# Patient Record
Sex: Female | Born: 1935 | Race: Black or African American | Hispanic: No
Health system: Southern US, Community
[De-identification: ages and names within clinical notes are randomized; demographics above are authoritative.]

---

## 2008-12-20 ENCOUNTER — Ambulatory Visit: Payer: Self-pay | Admitting: Internal Medicine

## 2009-01-30 ENCOUNTER — Ambulatory Visit: Payer: Self-pay | Admitting: Surgery

## 2010-08-21 NOTE — Assessment & Plan Note (Signed)
OFFICE VISIT   [name], [name]  DOB:  1916-05-03                                       01/30/2009  CHART#:[id number]   REASON FOR VISIT:  Carotid disease.   CHIEF COMPLAINT:  Of dizziness.   HISTORY:  This is a 77 year old female I am seeing at request of Dr.  Welton Flakes for evaluation of carotid disease.  The patient was found to have a  left carotid bruit approximately a year ago, it had increased in  intensity and therefore a carotid ultrasound was ordered.  This was  performed and found moderate left carotid stenosis.  A CT angiogram was  then obtained which reveals 55%-65% left carotid stenosis.  The patient  is asymptomatic.  She denies numbness or weakness in either extremity.  She denies slurring of speech.  She denies dysarthria.  She denies  amaurosis fugax.  The patient has a history of diabetes, her hemoglobin  A1c have been in the 6-7 range.  She has a history of hypertension for  which she is on chronic medical therapy.  She also has  hypercholesterolemia, but is no longer taking her medication.   REVIEW OF SYSTEMS:  Is positive for heart murmur and constipation,  burning with urination, frequent urination, dizziness and nervousness.  Otherwise review of systems is negative as detailed in the encounter  form.   PAST MEDICAL HISTORY:  Diabetes, hypertension, hypercholesterolemia.   PAST SURGICAL HISTORY:  Is a hysterectomy.   SOCIAL HISTORY:  She is widowed with five children.  She is retired.  Does not smoke.  Has never smoked.  Does not drink.   MEDICATIONS:  Please see medical record.   ALLERGIES:  PENICILLIN.   PHYSICAL EXAMINATION:  Heart rate 73, blood pressure 154/65, O2  saturations 97%.  General:  She is well-appearing, no distress.  HEENT:  Normocephalic, atraumatic.  Pupils equal.  Sclerae anicteric and  extraocular muscles are intact.  Neck:  Supple.  No JVD.  Left carotid  bruit.  Lungs are clear bilaterally without wheezing.   Cardiovascular:  Regular rate and rhythm.  Extremities are warm, well-perfused.  Abdomen:  Soft, nontender.  No masses.  Musculoskeletal:  No major deformities,  cyanosis.  Neurologic:  Nonfocal.  Psych is alert and x3.  Skin is  without rash.   DIAGNOSTIC STUDIES:  I have read the reports of the CT angiogram and  carotid Dopplers, she has mild left carotid stenosis.   ASSESSMENT/PLAN:  Asymptomatic left carotid stenosis.   PLAN:  I discussed the importance of going back on a cholesterol  lowering agent.  She is going to contact Dr. Welton Flakes to restart some form  of a statin and hopefully this will help stabilize the progression of  her disease.  At this point in time as long as she remains asymptomatic,  I would not recommend any surgical repair and I am going to have her  come back to see me in 1 year with a repeat ultrasound for continuity.   Jorge Ny, MD  Electronically Signed   VWB/MEDQ  D:  01/30/2009  T:  01/31/2009  Job:  [id number]   cc:   Dr. Welton Flakes

## 2010-10-24 IMAGING — CT CT ANGIOGRAPHY NECK
1 of 4 series · 12 of 33 positions shown · non-contrast
Comparison: none

REASON FOR EXAM: syncope  pt is diabetic takes Glucophage
COMMENTS:

[Series 4: soft tissue · axial · 0.38mm/px · z∈[-4,+252]mm · 12 of 101 slices shown]
[im 8/101  soft-tissue]
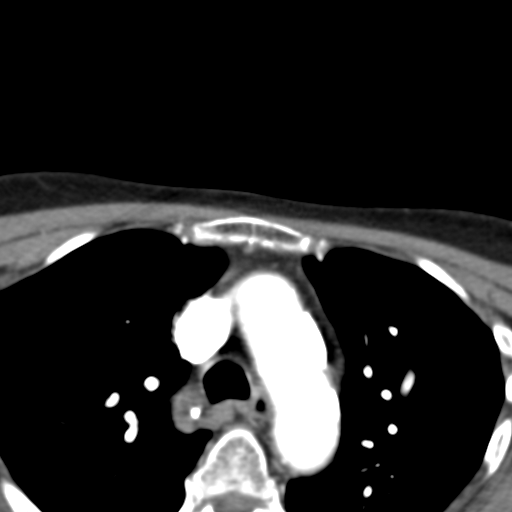
[im 16/101  bone]
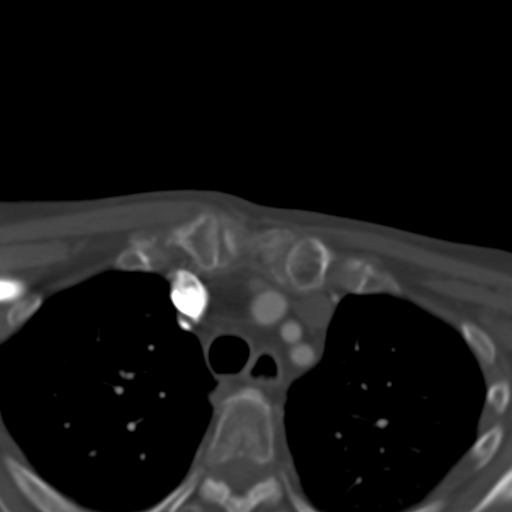
[im 24/101  soft-tissue]
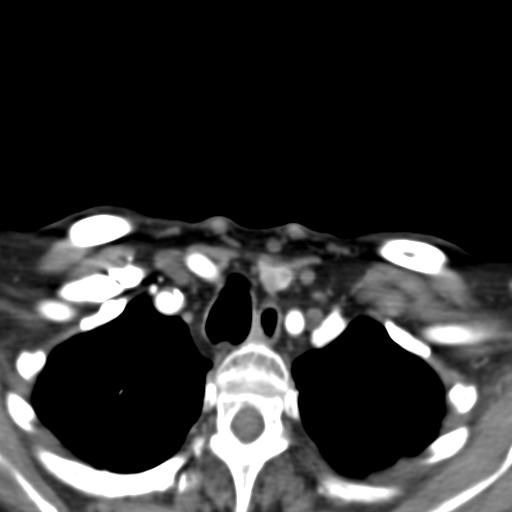
[im 31/101  bone]
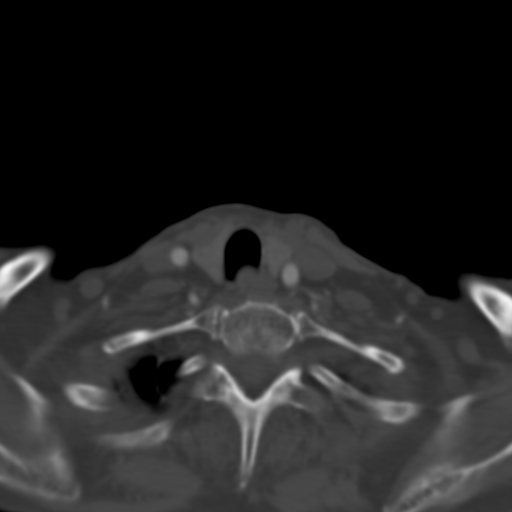
[im 39/101  soft-tissue]
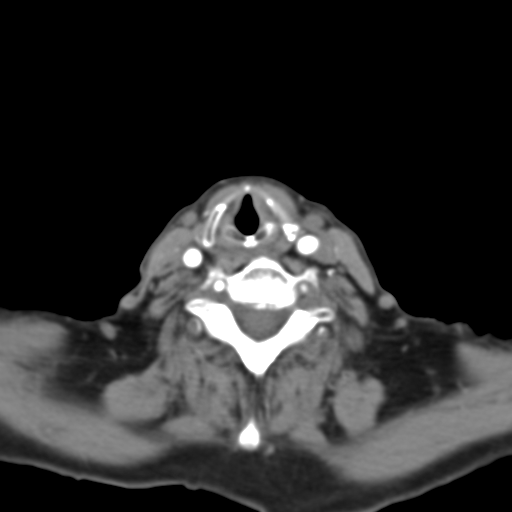
[im 47/101  bone]
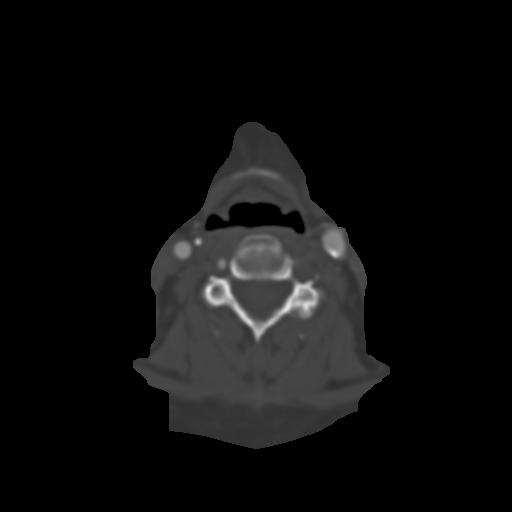
[im 54/101  soft-tissue]
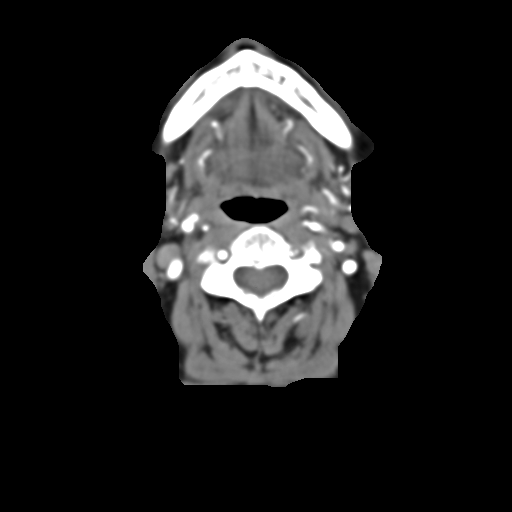
[im 62/101  bone]
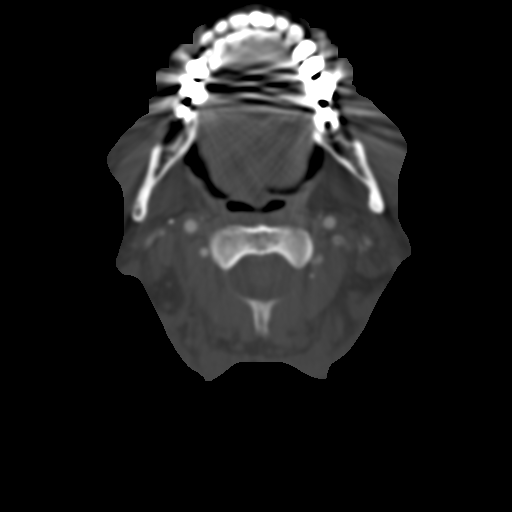
[im 70/101  soft-tissue]
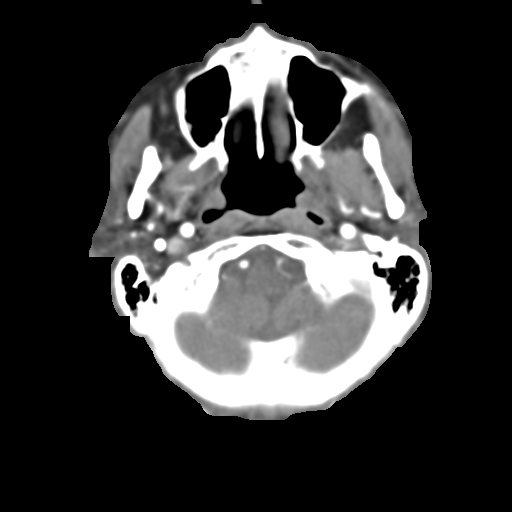
[im 77/101  bone]
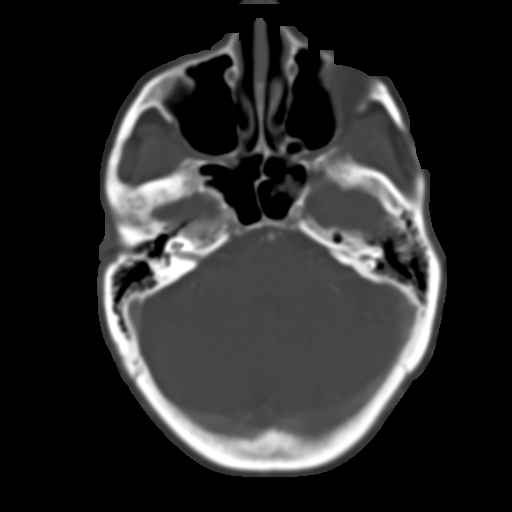
[im 85/101  soft-tissue]
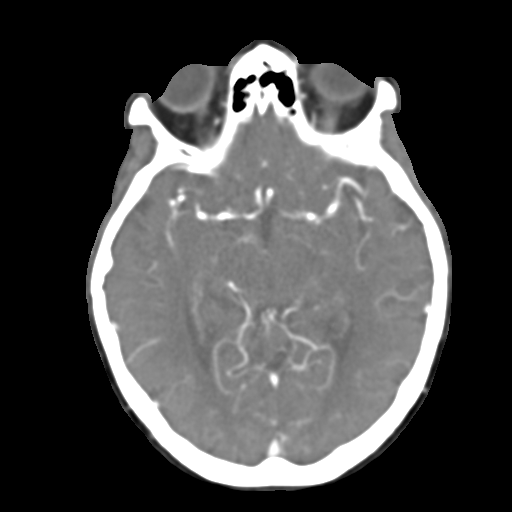
[im 93/101  bone]
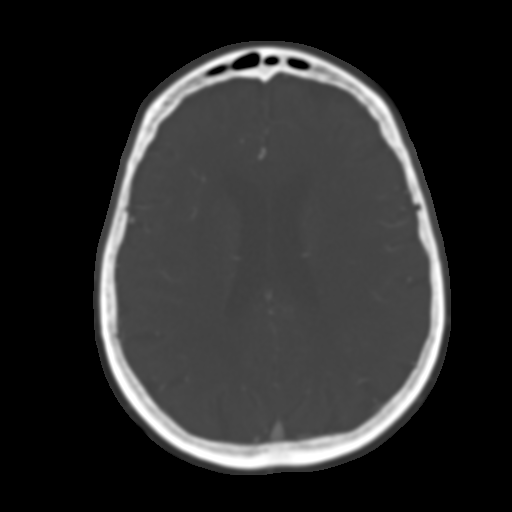

[12 of 33 positions shown; findings below may reference images not displayed]

PROCEDURE:     CT  - CT ANGIOGRAPHY NECK W/CONTRAST  - December 20, 2008  [DATE]

RESULT:     Carotid CT angiography was performed status post intravenous
administration of 100 ml Isovue 300.

Evaluation of the left carotid system demonstrates coarse mural
calcifications at the level of the carotid bulb. This partially obscures
visualization. There is the suggestion of moderate to slightly high grade
stenosis at this level. The level of stenosis appears to be on the magnitude
of 55 to 65%. No further regions of segmental or focal narrowing are
identified within the left carotid system. Evaluation of the right carotid
system demonstrates an area of mild mural calcification within the carotid
bulb. There is no evidence of appreciable hemodynamically significant
stenosis appreciated. There is no evidence of focal outpouchings, fusiform
dilatation within the right or left carotid system.
IMPRESSION: 1. Findings suspicious for a region of moderate to possibly mildly severe
stenosis within the carotid bulb on the left. Evaluation is partially
obscured by coarse mural calcifications. If clinically warranted this could
possibly be further evaluated with an MRA of the carotid system or catheter
directed angiography.

## 2011-09-05 ENCOUNTER — Ambulatory Visit: Payer: Self-pay | Admitting: Ophthalmology

## 2011-09-17 ENCOUNTER — Ambulatory Visit: Payer: Self-pay | Admitting: Ophthalmology

## 2011-11-26 ENCOUNTER — Ambulatory Visit: Payer: Self-pay | Admitting: Ophthalmology

## 2014-07-26 NOTE — Op Note (Signed)
PATIENT NAME:  [name], Kajuana C MR#:  [id number] DATE OF BIRTH:  Feb 02, 1917  DATE OF PROCEDURE:  11/26/2011  PREOPERATIVE DIAGNOSIS: Visually significant cataract of the right eye.   POSTOPERATIVE DIAGNOSIS: Visually significant cataract of the right eye.   OPERATIVE PROCEDURE: Cataract extraction by phacoemulsification with implant of intraocular lens to right eye.   SURGEON: Galen ManilaWilliam Logon Uttech, MD.   ANESTHESIA:  1. Managed anesthesia care.  2. Topical tetracaine drops followed by 2% Xylocaine jelly applied in the preoperative holding area.   COMPLICATIONS: None.   TECHNIQUE:  Stop-and-chop    DESCRIPTION OF PROCEDURE: The patient was examined and consented in the preoperative holding area where the aforementioned topical anesthesia was applied to the right eye and then brought back to the Operating Room where the right eye was prepped and draped in the usual sterile ophthalmic fashion and a lid speculum was placed. A paracentesis was created with the side port blade and the anterior chamber was filled with viscoelastic. A near clear corneal incision was performed with the steel keratome. A continuous curvilinear capsulorrhexis was performed with a cystotome followed by the capsulorrhexis forceps. Hydrodissection and hydrodelineation were carried out with BSS on a blunt cannula. The lens was removed in a stop-and-chop technique and the remaining cortical material was removed with the irrigation-aspiration handpiece. The capsular bag was inflated with viscoelastic and the Technus ZCB00 22.0-diopter lens, serial number [id number] was placed in the capsular bag without complication. The remaining viscoelastic was removed from the eye with the irrigation-aspiration handpiece. The wounds were hydrated. The anterior chamber was flushed with Miostat and the eye was inflated to physiologic pressure. The wounds were found to be water tight. The eye was dressed with Vigamox. The patient was given protective  glasses to wear throughout the day and a shield with which to sleep tonight. The patient was also given drops with which to begin a drop regimen today and will follow-up with me in one day.   ____________________________ Jerilee FieldWilliam L. Susane Bey, MD wlp:drc D: 11/26/2011 12:31:15 ET T: 11/26/2011 12:44:21 ET JOB#: [id number]  cc: Adamaris King L. Dyquan Minks, MD, <Dictator> Jerilee FieldWILLIAM L Nilay Mangrum MD ELECTRONICALLY SIGNED 11/28/2011 17:18

## 2014-07-31 NOTE — Op Note (Signed)
PATIENT NAME:  [name], [name] MR#:  [id number] DATE OF BIRTH:  [birth year]  DATE OF PROCEDURE:  09/17/2011  PREOPERATIVE DIAGNOSIS: Visually significant cataract of the left eye.   POSTOPERATIVE DIAGNOSIS: Visually significant cataract of the left eye.   OPERATIVE PROCEDURE: Cataract extraction by phacoemulsification with implant of intraocular lens to left eye.   SURGEON: Galen ManilaWilliam Arleene Settle, MD.   ANESTHESIA:  1. Managed anesthesia care.  2. Topical tetracaine drops followed by 2% Xylocaine jelly applied in the preoperative holding area.   COMPLICATIONS: None.   TECHNIQUE:  Stop and chop.   DESCRIPTION OF PROCEDURE: The patient was examined and consented in the preoperative holding area where the aforementioned topical anesthesia was applied to the left eye and then brought back to the Operating Room where the left eye was prepped and draped in the usual sterile ophthalmic fashion and a lid speculum was placed. A paracentesis was created with the side port blade and the anterior chamber was filled with viscoelastic. A near clear corneal incision was performed with the steel keratome. A continuous curvilinear capsulorrhexis was performed with a cystotome followed by the capsulorrhexis forceps. Hydrodissection and hydrodelineation were carried out with BSS on a blunt cannula. The lens was removed in a stop and chop technique and the remaining cortical material was removed with the irrigation-aspiration handpiece. The capsular bag was inflated with viscoelastic and the [employer]ecnis ZCB00 22.0-diopter lens, serial number [id number] was placed in the capsular bag without complication. The remaining viscoelastic was removed from the eye with the irrigation-aspiration handpiece. The wounds were hydrated. The anterior chamber was flushed with Miostat and the eye was inflated to physiologic pressure. The wounds were found to be water tight. The eye was dressed with Vigamox. The patient was given protective  glasses to wear throughout the day and a shield with which to sleep tonight. The patient was also given drops with which to begin a drop regimen today and will follow-up with me in one day.  ____________________________ Jerilee FieldWilliam L. Ivori Storr, MD wlp:slb D: 09/17/2011 12:37:55 ET T: 09/17/2011 13:10:44 ET JOB#: [id number]  cc: Davonne Jarnigan L. Fradel Baldonado, MD, <Dictator> Jerilee FieldWILLIAM L Chelesea Weiand MD ELECTRONICALLY SIGNED 09/19/2011 12:12

## 2016-12-07 DEATH — deceased
